# Patient Record
Sex: Male | Born: 1945 | Race: White | Hispanic: No | Marital: Married | Smoking: Never smoker
Health system: Southern US, Community
[De-identification: ages and names within clinical notes are randomized; demographics above are authoritative.]

## PROBLEM LIST (undated history)

## (undated) DIAGNOSIS — E785 Hyperlipidemia, unspecified: Secondary | ICD-10-CM

## (undated) HISTORY — DX: Hyperlipidemia, unspecified: E78.5

## (undated) HISTORY — PX: SHOULDER SURGERY: SHX246

## (undated) HISTORY — PX: HERNIA REPAIR: SHX51

---

## 2018-03-07 ENCOUNTER — Encounter: Payer: Self-pay | Admitting: *Deleted

## 2018-03-11 ENCOUNTER — Encounter: Payer: Self-pay | Admitting: *Deleted

## 2018-03-12 ENCOUNTER — Encounter

## 2018-03-12 ENCOUNTER — Telehealth: Payer: Self-pay | Admitting: Diagnostic Neuroimaging

## 2018-03-12 ENCOUNTER — Ambulatory Visit (INDEPENDENT_AMBULATORY_CARE_PROVIDER_SITE_OTHER): Payer: Federal, State, Local not specified - PPO | Admitting: Diagnostic Neuroimaging

## 2018-03-12 ENCOUNTER — Encounter: Payer: Self-pay | Admitting: Diagnostic Neuroimaging

## 2018-03-12 VITALS — BP 153/89 | HR 69 | Ht 70.0 in | Wt 200.8 lb

## 2018-03-12 DIAGNOSIS — R200 Anesthesia of skin: Secondary | ICD-10-CM

## 2018-03-12 DIAGNOSIS — R531 Weakness: Secondary | ICD-10-CM | POA: Diagnosis not present

## 2018-03-12 NOTE — Telephone Encounter (Signed)
Medicare/bcbs fed order sent to GI. No auth they will reach out to the pt to schedule.  °

## 2018-03-12 NOTE — Progress Notes (Signed)
[facility]  PATIENT: Brent Garcia DOB: Feb 22, 1946  REFERRING CLINICIAN: Dimas Chyle HISTORY FROM: patient  REASON FOR VISIT: new consult    HISTORICAL  CHIEF COMPLAINT:  Chief Complaint  Patient presents with  . New Patient (Initial Visit)    Rm 7, alone  . Numbness/ tingling of hands,  bil. feet    Referral Dr. Cyndia Bent.  Noted sx for 4 months. Notes when fatigued has more pain.  Takes aleve daily.  Hx of back pain 10 yrs ago and R leg pain one yr ago.     HISTORY OF PRESENT ILLNESS:   72 year old male here for evaluation of numbness and tingling.  4 months ago patient noticed onset of numbness and tingling in right greater than left hand, mainly in digits 4 and 5 bilaterally.  He also notes numbness in the balls of his feet.  Over time he noticed some pain and discomfort shooting in his right arm and right hand when he would lift it over his head and try to reach for an object.  Sometimes this generalized pain and weakness would radiate throughout his neck, arms and legs.  Patient has remote history of right low back pain radiating to the right leg with "tenderness" from a few years ago.  This has slightly improved but is persistent.  No other recent triggering factors.    REVIEW OF SYSTEMS: Full 14 system review of systems performed and negative with exception of: Numbness joint pain aching muscles.  ALLERGIES: No Known Allergies  HOME MEDICATIONS: Outpatient Medications Prior to Visit  Medication Sig Dispense Refill  . aspirin 81 MG chewable tablet Chew by mouth.    . cholecalciferol (VITAMIN D) 1000 units tablet Take 1,000 Units by mouth daily.    Marland Kitchen lovastatin (MEVACOR) 40 MG tablet TAKE 2 TABLETS BY MOUTH EVERY DAY    . naproxen sodium (ALEVE) 220 MG tablet Take by mouth.    . diclofenac (VOLTAREN) 75 MG EC tablet Take by mouth.    . sildenafil (VIAGRA) 100 MG tablet Take by mouth.     No facility-administered medications prior to visit.      PAST MEDICAL HISTORY: Past Medical History:  Diagnosis Date  . Hyperlipidemia     PAST SURGICAL HISTORY: Past Surgical History:  Procedure Laterality Date  . HERNIA REPAIR  1998?    FAMILY HISTORY: Family History  Problem Relation Age of Onset  . Heart attack Mother   . Non-Hodgkin's lymphoma Father   . Stroke Paternal Grandmother   . Heart Problems Paternal Grandfather     SOCIAL HISTORY: Social History   Socioeconomic History  . Marital status: Married    Spouse name: Not on file  . Number of children: Not on file  . Years of education: Not on file  . Highest education level: Not on file  Occupational History  . Not on file  Social Needs  . Financial resource strain: Not on file  . Food insecurity:    Worry: Not on file    Inability: Not on file  . Transportation needs:    Medical: Not on file    Non-medical: Not on file  Tobacco Use  . Smoking status: Never Smoker  . Smokeless tobacco: Never Used  Substance and Sexual Activity  . Alcohol use: Yes  . Drug use: Not on file  . Sexual activity: Not on file  Lifestyle  . Physical activity:    Days per week: Not on file    Minutes  per session: Not on file  . Stress: Not on file  Relationships  . Social connections:    Talks on phone: Not on file    Gets together: Not on file    Attends religious service: Not on file    Active member of club or organization: Not on file    Attends meetings of clubs or organizations: Not on file    Relationship status: Not on file  . Intimate partner violence:    Fear of current or ex partner: Not on file    Emotionally abused: Not on file    Physically abused: Not on file    Forced sexual activity: Not on file  Other Topics Concern  . Not on file  Social History Narrative   Lives with wife in home.  Education BA degree.  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  Children 2.       PHYSICAL EXAM  GENERAL EXAM/CONSTITUTIONAL: Vitals:  Vitals:   03/12/18 0809  BP: (!)  153/89  Pulse: 69  Weight: 200 lb 12.8 oz (91.1 kg)  Height: 5\' 10"  (1.778 m)     Body mass index is 28.81 kg/m. Wt Readings from Last 3 Encounters:  03/12/18 200 lb 12.8 oz (91.1 kg)     Patient is in no distress; well developed, nourished and groomed; neck is supple  CARDIOVASCULAR:  Examination of carotid arteries is normal; no carotid bruits  Regular rate and rhythm, no murmurs  Examination of peripheral vascular system by observation and palpation is normal  EYES:  Ophthalmoscopic exam of optic discs and posterior segments is normal; no papilledema or hemorrhages  Visual Acuity Screening   Right eye Left eye Both eyes  Without correction: 20/40 20/20   With correction:        MUSCULOSKELETAL:  Gait, strength, tone, movements noted in Neurologic exam below  NEUROLOGIC: MENTAL STATUS:  No flowsheet data found.  awake, alert, oriented to person, place and time  recent and remote memory intact  normal attention and concentration  language fluent, comprehension intact, naming intact  fund of knowledge appropriate  CRANIAL NERVE:   2nd - no papilledema on fundoscopic exam  2nd, 3rd, 4th, 6th - pupils equal and reactive to light, visual fields full to confrontation, extraocular muscles intact, no nystagmus  5th - facial sensation symmetric  7th - facial strength symmetric  8th - hearing intact  9th - palate elevates symmetrically, uvula midline  11th - shoulder shrug symmetric  12th - tongue protrusion midline  MOTOR:   FASCICULATIONS IN BILATERAL TRICEPS  normal bulk and tone, full strength in the BUE, BLE; EXCEPT BILATERAL TRICEPS 3  SENSORY:   normal and symmetric to light touch, pinprick, temperature, vibration; EXCEPT DECR TEMP AND VIB IN FEET; DECR PP IN FINGERTIPS (DIGIT 2 BILATERALLY)  COORDINATION:   finger-nose-finger, fine finger movements normal  REFLEXES:   deep tendon reflexes --> BUE ABSENT; BLE BRISK IN KNEES (L > R);  ANKLES 2; DOWN GOING TOES  GAIT/STATION:   narrow based gait; able to walk on toes, heels; romberg is negative     DIAGNOSTIC DATA (LABS, IMAGING, TESTING) - I reviewed patient records, labs, notes, testing and imaging myself where available.  No results found for: WBC, HGB, HCT, MCV, PLT No results found for: NA, K, CL, CO2, GLUCOSE, BUN, CREATININE, CALCIUM, PROT, ALBUMIN, AST, ALT, ALKPHOS, BILITOT, GFRNONAA, GFRAA No results found for: CHOL, HDL, LDLCALC, LDLDIRECT, TRIG, CHOLHDL No results found for: BOFB5Z No results found for: VITAMINB12 No  results found for: TSH   01/07/18 B12 - 664  01/07/18 TSH 3.070   ASSESSMENT AND PLAN  72 y.o. year old male here with upper and lower extremity numbness and tingling, weakness and fasciculations in bilateral triceps, slightly brisk reflexes in the knees.  Signs and symptoms are concerning for cervical radiculopathy and myelopathy.  We will proceed with further work-up.  Subacute onset of peripheral neuropathy also a possibility.   Ddx: cervical radiculopathies / myelopathy vs neuropathy (? CIDP)  1. Weakness   2. Numbness       PLAN:  - check MRI cervical (rule out myelopathy / radiculopathy) - then may consider EMG/NCS  Orders Placed This Encounter  Procedures  . MR CERVICAL SPINE WO CONTRAST   Return pending test results.    Suanne Marker, MD 03/12/2018, 8:42 AM Certified in Neurology, Neurophysiology and Neuroimaging  [facility] [address], [address] [place], [place] [address] [phone]

## 2018-03-12 NOTE — Patient Instructions (Signed)
-   check MRI cervical spine 

## 2018-03-18 ENCOUNTER — Ambulatory Visit
Admission: RE | Admit: 2018-03-18 | Discharge: 2018-03-18 | Disposition: A | Discharge status: Home / Self Care | Payer: Federal, State, Local not specified - PPO | Source: Ambulatory Visit | Attending: Diagnostic Neuroimaging | Admitting: Diagnostic Neuroimaging

## 2018-03-18 DIAGNOSIS — R200 Anesthesia of skin: Secondary | ICD-10-CM

## 2018-03-18 DIAGNOSIS — R531 Weakness: Secondary | ICD-10-CM

## 2018-03-19 ENCOUNTER — Telehealth: Payer: Self-pay | Admitting: Diagnostic Neuroimaging

## 2018-03-19 DIAGNOSIS — G9520 Unspecified cord compression: Secondary | ICD-10-CM

## 2018-03-19 NOTE — Telephone Encounter (Signed)
I called patient. MRI shows severe spinal stenosis in the cervical spine. I spoke to patient and wife. I recommend neurosurgery consult. Patient agrees.   Orders Placed This Encounter  Procedures  . Ambulatory referral to Neurosurgery   Suanne Marker, MD 03/19/2018, 6:24 PM Certified in Neurology, Neurophysiology and Neuroimaging  [facility] [address], [address] [place], [place] [address] [phone]

## 2018-03-20 ENCOUNTER — Other Ambulatory Visit: Payer: Self-pay | Admitting: Neurological Surgery

## 2018-03-31 NOTE — Pre-Procedure Instructions (Signed)
Brent Garcia  03/31/2018      [id number] - [place], [place] - [address] [place] [address] Phone: [phone] Fax: [phone]    Your procedure is scheduled on April 09, 2018.  Report to [facility] Admitting at 0800 AM.  Call this number if you have problems the morning of surgery:  [phone]   Remember:  Do not eat or drink after midnight.    Take these medicines the morning of surgery with A SIP OF WATER -none  Follow your surgeon's instructions on when to hold/resume aspirin.  If no instructions were given call the office to determine how they would like to you take aspirin  7 days prior to surgery STOP taking any Aleve, Naproxen, Ibuprofen, Motrin, Advil, Goody's, BC's, all herbal medications, fish oil, and all vitamins    Do not wear jewelry  Do not wear lotions, powders, or colognes, or deodorant.  Men may shave face and neck.  Do not bring valuables to the hospital.  [facility] is not responsible for any belongings or valuables.  Contacts, dentures or bridgework may not be worn into surgery.  Leave your suitcase in the car.  After surgery it may be brought to your room.  For patients admitted to the hospital, discharge time will be determined by your treatment team.  Patients discharged the day of surgery will not be allowed to drive home.    [facility]- Preparing For Surgery  Before surgery, you can play an important role. Because skin is not sterile, your skin needs to be as free of germs as possible. You can reduce the number of germs on your skin by washing with CHG (chlorahexidine gluconate) Soap before surgery.  CHG is an antiseptic cleaner which kills germs and bonds with the skin to continue killing germs even after washing.    Oral Hygiene is also important to reduce your risk of infection.  Remember - BRUSH YOUR TEETH THE MORNING OF SURGERY WITH YOUR REGULAR  TOOTHPASTE  Please do not use if you have an allergy to CHG or antibacterial soaps. If your skin becomes reddened/irritated stop using the CHG.  Do not shave (including legs and underarms) for at least 48 hours prior to first CHG shower. It is OK to shave your face.  Please follow these instructions carefully.   1. Shower the NIGHT BEFORE SURGERY and the MORNING OF SURGERY with CHG.   2. If you chose to wash your hair, wash your hair first as usual with your normal shampoo.  3. After you shampoo, rinse your hair and body thoroughly to remove the shampoo.  4. Use CHG as you would any other liquid soap. You can apply CHG directly to the skin and wash gently with a scrungie or a clean washcloth.   5. Apply the CHG Soap to your body ONLY FROM THE NECK DOWN.  Do not use on open wounds or open sores. Avoid contact with your eyes, ears, mouth and genitals (private parts). Wash Face and genitals (private parts)  with your normal soap.  6. Wash thoroughly, paying special attention to the area where your surgery will be performed.  7. Thoroughly rinse your body with warm water from the neck down.  8. DO NOT shower/wash with your normal soap after using and rinsing off the CHG Soap.  9. Pat yourself dry with a CLEAN TOWEL.  10. Wear CLEAN  PAJAMAS to bed the night before surgery, wear comfortable clothes the morning of surgery  11. Place CLEAN SHEETS on your bed the night of your first shower and DO NOT SLEEP WITH PETS.  Day of Surgery:  Do not apply any deodorants/lotions.  Please wear clean clothes to the hospital/surgery center.   Remember to brush your teeth WITH YOUR REGULAR TOOTHPASTE.   Please read over the following fact sheets that you were given. Pain Booklet, Coughing and Deep Breathing, MRSA Information and Surgical Site Infection Prevention

## 2018-03-31 NOTE — Progress Notes (Addendum)
PCP: [name], MD  Cardiologist: pt denies  EKG: pt denies past year, obtained today  Stress test: pt denies  ECHO: pt denies  Cardiac Cath: pt denies  Chest x-ray: pt denies past year, no recent respiratory infections/complications  Pt on aspirin 81 mg -started holding 04/01/18 per MD instructions

## 2018-04-01 ENCOUNTER — Encounter (HOSPITAL_COMMUNITY): Payer: Self-pay

## 2018-04-01 ENCOUNTER — Encounter (HOSPITAL_COMMUNITY)
Admission: RE | Admit: 2018-04-01 | Discharge: 2018-04-01 | Disposition: A | Discharge status: Home / Self Care | Payer: Medicare Other | Source: Ambulatory Visit | Attending: Neurological Surgery | Admitting: Neurological Surgery

## 2018-04-01 ENCOUNTER — Other Ambulatory Visit: Payer: Self-pay

## 2018-04-01 ENCOUNTER — Ambulatory Visit (HOSPITAL_COMMUNITY)
Admission: RE | Admit: 2018-04-01 | Discharge: 2018-04-01 | Disposition: A | Discharge status: Home / Self Care | Payer: Medicare Other | Source: Ambulatory Visit | Attending: Neurological Surgery | Admitting: Neurological Surgery

## 2018-04-01 DIAGNOSIS — R531 Weakness: Secondary | ICD-10-CM | POA: Diagnosis not present

## 2018-04-01 DIAGNOSIS — Z79899 Other long term (current) drug therapy: Secondary | ICD-10-CM | POA: Insufficient documentation

## 2018-04-01 DIAGNOSIS — E785 Hyperlipidemia, unspecified: Secondary | ICD-10-CM | POA: Diagnosis not present

## 2018-04-01 DIAGNOSIS — Z01818 Encounter for other preprocedural examination: Secondary | ICD-10-CM | POA: Insufficient documentation

## 2018-04-01 DIAGNOSIS — Z7982 Long term (current) use of aspirin: Secondary | ICD-10-CM | POA: Diagnosis not present

## 2018-04-01 DIAGNOSIS — R200 Anesthesia of skin: Secondary | ICD-10-CM | POA: Insufficient documentation

## 2018-04-01 DIAGNOSIS — M4802 Spinal stenosis, cervical region: Secondary | ICD-10-CM

## 2018-04-01 LAB — CBC WITH DIFFERENTIAL/PLATELET
ABS IMMATURE GRANULOCYTES: 0.1 10*3/uL (ref 0.0–0.1)
BASOS ABS: 0.1 10*3/uL (ref 0.0–0.1)
BASOS PCT: 1 %
Eosinophils Absolute: 0.3 10*3/uL (ref 0.0–0.7)
Eosinophils Relative: 4 %
HCT: 47.4 % (ref 39.0–52.0)
Hemoglobin: 15.2 g/dL (ref 13.0–17.0)
IMMATURE GRANULOCYTES: 1 %
Lymphocytes Relative: 22 %
Lymphs Abs: 1.5 10*3/uL (ref 0.7–4.0)
MCH: 31.7 pg (ref 26.0–34.0)
MCHC: 32.1 g/dL (ref 30.0–36.0)
MCV: 99 fL (ref 78.0–100.0)
Monocytes Absolute: 0.7 10*3/uL (ref 0.1–1.0)
Monocytes Relative: 10 %
NEUTROS ABS: 4.4 10*3/uL (ref 1.7–7.7)
NEUTROS PCT: 62 %
PLATELETS: 277 10*3/uL (ref 150–400)
RBC: 4.79 MIL/uL (ref 4.22–5.81)
RDW: 11.6 % (ref 11.5–15.5)
WBC: 7 10*3/uL (ref 4.0–10.5)

## 2018-04-01 LAB — BASIC METABOLIC PANEL
ANION GAP: 10 (ref 5–15)
BUN: 19 mg/dL (ref 8–23)
CALCIUM: 9.5 mg/dL (ref 8.9–10.3)
CO2: 25 mmol/L (ref 22–32)
Chloride: 104 mmol/L (ref 98–111)
Creatinine, Ser: 1.18 mg/dL (ref 0.61–1.24)
Glucose, Bld: 115 mg/dL — ABNORMAL HIGH (ref 70–99)
Potassium: 4.2 mmol/L (ref 3.5–5.1)
Sodium: 139 mmol/L (ref 135–145)

## 2018-04-01 LAB — SURGICAL PCR SCREEN
MRSA, PCR: NEGATIVE
Staphylococcus aureus: NEGATIVE

## 2018-04-01 LAB — ABO/RH: ABO/RH(D): O POS

## 2018-04-01 LAB — TYPE AND SCREEN
ABO/RH(D): O POS
ANTIBODY SCREEN: NEGATIVE

## 2018-04-01 LAB — PROTIME-INR
INR: 1.06
Prothrombin Time: 13.7 seconds (ref 11.4–15.2)

## 2018-04-08 NOTE — Anesthesia Preprocedure Evaluation (Addendum)
Anesthesia Evaluation  Patient identified by MRN, date of birth, ID band Patient awake    Reviewed: Allergy & Precautions, NPO status , Patient's Chart, lab work & pertinent test results  Airway Mallampati: II  TM Distance: >3 FB Neck ROM: Full    Dental  (+) Dental Advisory Given   Pulmonary neg pulmonary ROS,    breath sounds clear to auscultation       Cardiovascular negative cardio ROS   Rhythm:Regular Rate:Normal     Neuro/Psych negative neurological ROS     GI/Hepatic negative GI ROS, Neg liver ROS,   Endo/Other  negative endocrine ROS  Renal/GU negative Renal ROS     Musculoskeletal   Abdominal   Peds  Hematology negative hematology ROS (+)   Anesthesia Other Findings   Reproductive/Obstetrics                            Lab Results  Component Value Date   WBC 7.0 04/01/2018   HGB 15.2 04/01/2018   HCT 47.4 04/01/2018   MCV 99.0 04/01/2018   PLT 277 04/01/2018   Lab Results  Component Value Date   CREATININE 1.18 04/01/2018   BUN 19 04/01/2018   NA 139 04/01/2018   K 4.2 04/01/2018   CL 104 04/01/2018   CO2 25 04/01/2018    Anesthesia Physical Anesthesia Plan  ASA: II  Anesthesia Plan: General   Post-op Pain Management:    Induction: Intravenous  PONV Risk Score and Plan: 2 and Ondansetron, Dexamethasone and Treatment may vary due to age or medical condition  Airway Management Planned: Oral ETT and Video Laryngoscope Planned  Additional Equipment:   Intra-op Plan:   Post-operative Plan: Extubation in OR  Informed Consent: I have reviewed the patients History and Physical, chart, labs and discussed the procedure including the risks, benefits and alternatives for the proposed anesthesia with the patient or authorized representative who has indicated his/her understanding and acceptance.   Dental advisory given  Plan Discussed with: CRNA  Anesthesia Plan  Comments:        Anesthesia Quick Evaluation

## 2018-04-09 ENCOUNTER — Inpatient Hospital Stay (HOSPITAL_COMMUNITY): Payer: Medicare Other | Admitting: Anesthesiology

## 2018-04-09 ENCOUNTER — Inpatient Hospital Stay (HOSPITAL_COMMUNITY)
Admission: RE | Admit: 2018-04-09 | Discharge: 2018-04-10 | DRG: 473 | Disposition: A | Discharge status: Home / Self Care | Payer: Medicare Other | Source: Ambulatory Visit | Attending: Neurological Surgery | Admitting: Neurological Surgery

## 2018-04-09 ENCOUNTER — Encounter (HOSPITAL_COMMUNITY): Payer: Self-pay | Admitting: Urology

## 2018-04-09 ENCOUNTER — Encounter (HOSPITAL_COMMUNITY)
Admission: RE | Disposition: A | Discharge status: Home / Self Care | Payer: Self-pay | Source: Ambulatory Visit | Attending: Neurological Surgery

## 2018-04-09 ENCOUNTER — Inpatient Hospital Stay (HOSPITAL_COMMUNITY): Payer: Medicare Other

## 2018-04-09 DIAGNOSIS — Z419 Encounter for procedure for purposes other than remedying health state, unspecified: Secondary | ICD-10-CM

## 2018-04-09 DIAGNOSIS — Z7982 Long term (current) use of aspirin: Secondary | ICD-10-CM | POA: Diagnosis not present

## 2018-04-09 DIAGNOSIS — Z79899 Other long term (current) drug therapy: Secondary | ICD-10-CM | POA: Diagnosis not present

## 2018-04-09 DIAGNOSIS — E785 Hyperlipidemia, unspecified: Secondary | ICD-10-CM | POA: Diagnosis present

## 2018-04-09 DIAGNOSIS — M4712 Other spondylosis with myelopathy, cervical region: Principal | ICD-10-CM | POA: Diagnosis present

## 2018-04-09 DIAGNOSIS — Z791 Long term (current) use of non-steroidal anti-inflammatories (NSAID): Secondary | ICD-10-CM

## 2018-04-09 DIAGNOSIS — M50221 Other cervical disc displacement at C4-C5 level: Secondary | ICD-10-CM | POA: Diagnosis present

## 2018-04-09 DIAGNOSIS — M4322 Fusion of spine, cervical region: Secondary | ICD-10-CM | POA: Diagnosis present

## 2018-04-09 HISTORY — PX: ANTERIOR CERVICAL DECOMPRESSION/DISCECTOMY FUSION 4 LEVELS: SHX5556

## 2018-04-09 SURGERY — ANTERIOR CERVICAL DECOMPRESSION/DISCECTOMY FUSION 4 LEVELS
Anesthesia: General | Site: Spine Cervical

## 2018-04-09 MED ORDER — HEMOSTATIC AGENTS (NO CHARGE) OPTIME
TOPICAL | Status: DC | PRN
  Administered 2018-04-09: 1 via TOPICAL

## 2018-04-09 MED ORDER — OXYCODONE HCL 5 MG PO TABS: 5.0000 mg | ORAL_TABLET | ORAL | Status: DC | PRN

## 2018-04-09 MED ORDER — ONDANSETRON HCL 4 MG/2ML IJ SOLN
INTRAMUSCULAR | Status: AC
  Filled 2018-04-09: qty 2

## 2018-04-09 MED ORDER — METHOCARBAMOL 500 MG PO TABS
500.0000 mg | ORAL_TABLET | Freq: Four times a day (QID) | ORAL | Status: DC | PRN
  Administered 2018-04-09 – 2018-04-10 (×3): 500 mg via ORAL
  Filled 2018-04-09 (×3): qty 1

## 2018-04-09 MED ORDER — ROCURONIUM BROMIDE 50 MG/5ML IV SOSY
PREFILLED_SYRINGE | INTRAVENOUS | Status: DC | PRN
  Administered 2018-04-09: 10 mg via INTRAVENOUS
  Administered 2018-04-09: 20 mg via INTRAVENOUS
  Administered 2018-04-09 (×2): 10 mg via INTRAVENOUS
  Administered 2018-04-09: 20 mg via INTRAVENOUS
  Administered 2018-04-09: 50 mg via INTRAVENOUS
  Administered 2018-04-09 (×3): 20 mg via INTRAVENOUS

## 2018-04-09 MED ORDER — THROMBIN 5000 UNITS EX SOLR
OROMUCOSAL | Status: DC | PRN
  Administered 2018-04-09: 5 mL via TOPICAL

## 2018-04-09 MED ORDER — ROCURONIUM BROMIDE 50 MG/5ML IV SOSY
PREFILLED_SYRINGE | INTRAVENOUS | Status: AC
  Filled 2018-04-09: qty 5

## 2018-04-09 MED ORDER — PHENOL 1.4 % MT LIQD: 1.0000 | OROMUCOSAL | Status: DC | PRN

## 2018-04-09 MED ORDER — CHLORHEXIDINE GLUCONATE CLOTH 2 % EX PADS: 6.0000 | MEDICATED_PAD | Freq: Once | CUTANEOUS | Status: DC

## 2018-04-09 MED ORDER — ONDANSETRON HCL 4 MG/2ML IJ SOLN
INTRAMUSCULAR | Status: DC | PRN
  Administered 2018-04-09: 4 mg via INTRAVENOUS

## 2018-04-09 MED ORDER — PROPOFOL 10 MG/ML IV BOLUS
INTRAVENOUS | Status: AC
  Filled 2018-04-09: qty 20

## 2018-04-09 MED ORDER — FENTANYL CITRATE (PF) 250 MCG/5ML IJ SOLN
INTRAMUSCULAR | Status: AC
  Filled 2018-04-09: qty 5

## 2018-04-09 MED ORDER — PROMETHAZINE HCL 25 MG/ML IJ SOLN: 6.2500 mg | INTRAMUSCULAR | Status: DC | PRN

## 2018-04-09 MED ORDER — SODIUM CHLORIDE 0.9% FLUSH: 3.0000 mL | Freq: Two times a day (BID) | INTRAVENOUS | Status: DC

## 2018-04-09 MED ORDER — SUGAMMADEX SODIUM 200 MG/2ML IV SOLN
INTRAVENOUS | Status: DC | PRN
  Administered 2018-04-09: 200 mg via INTRAVENOUS

## 2018-04-09 MED ORDER — 0.9 % SODIUM CHLORIDE (POUR BTL) OPTIME
TOPICAL | Status: DC | PRN
  Administered 2018-04-09: 1000 mL

## 2018-04-09 MED ORDER — DEXAMETHASONE SODIUM PHOSPHATE 10 MG/ML IJ SOLN
INTRAMUSCULAR | Status: AC
  Filled 2018-04-09: qty 1

## 2018-04-09 MED ORDER — PHENYLEPHRINE 40 MCG/ML (10ML) SYRINGE FOR IV PUSH (FOR BLOOD PRESSURE SUPPORT)
PREFILLED_SYRINGE | INTRAVENOUS | Status: DC | PRN
  Administered 2018-04-09 (×5): 80 ug via INTRAVENOUS

## 2018-04-09 MED ORDER — LIDOCAINE 2% (20 MG/ML) 5 ML SYRINGE
INTRAMUSCULAR | Status: AC
  Filled 2018-04-09: qty 5

## 2018-04-09 MED ORDER — SENNA 8.6 MG PO TABS
1.0000 | ORAL_TABLET | Freq: Two times a day (BID) | ORAL | Status: DC
  Administered 2018-04-09: 8.6 mg via ORAL
  Filled 2018-04-09: qty 1

## 2018-04-09 MED ORDER — CEFAZOLIN SODIUM-DEXTROSE 2-4 GM/100ML-% IV SOLN
2.0000 g | INTRAVENOUS | Status: AC
  Administered 2018-04-09: 2 g via INTRAVENOUS

## 2018-04-09 MED ORDER — SODIUM CHLORIDE 0.9 % IV SOLN
250.0000 mL | INTRAVENOUS | Status: DC
  Administered 2018-04-09: 250 mL via INTRAVENOUS

## 2018-04-09 MED ORDER — ONDANSETRON HCL 4 MG PO TABS: 4.0000 mg | ORAL_TABLET | Freq: Four times a day (QID) | ORAL | Status: DC | PRN

## 2018-04-09 MED ORDER — DEXAMETHASONE SODIUM PHOSPHATE 4 MG/ML IJ SOLN
INTRAMUSCULAR | Status: DC | PRN
  Administered 2018-04-09: 10 mg via INTRAVENOUS

## 2018-04-09 MED ORDER — ALBUMIN HUMAN 5 % IV SOLN
INTRAVENOUS | Status: DC | PRN
  Administered 2018-04-09: 12:00:00 via INTRAVENOUS

## 2018-04-09 MED ORDER — MIDAZOLAM HCL 2 MG/2ML IJ SOLN
INTRAMUSCULAR | Status: AC
  Filled 2018-04-09: qty 2

## 2018-04-09 MED ORDER — ROCURONIUM BROMIDE 100 MG/10ML IV SOLN: INTRAVENOUS | Status: DC | PRN

## 2018-04-09 MED ORDER — HYDROMORPHONE HCL 1 MG/ML IJ SOLN: 0.5000 mg | INTRAMUSCULAR | Status: DC | PRN

## 2018-04-09 MED ORDER — CEFAZOLIN SODIUM-DEXTROSE 2-4 GM/100ML-% IV SOLN
INTRAVENOUS | Status: AC
  Filled 2018-04-09: qty 100

## 2018-04-09 MED ORDER — THROMBIN (RECOMBINANT) 20000 UNITS EX SOLR
CUTANEOUS | Status: AC
  Filled 2018-04-09: qty 20000

## 2018-04-09 MED ORDER — POTASSIUM CHLORIDE IN NACL 20-0.9 MEQ/L-% IV SOLN: INTRAVENOUS | Status: DC

## 2018-04-09 MED ORDER — ACETAMINOPHEN 650 MG RE SUPP: 650.0000 mg | RECTAL | Status: DC | PRN

## 2018-04-09 MED ORDER — LACTATED RINGERS IV SOLN
INTRAVENOUS | Status: DC
  Administered 2018-04-09: 08:00:00 via INTRAVENOUS

## 2018-04-09 MED ORDER — ACETAMINOPHEN 500 MG PO TABS
500.0000 mg | ORAL_TABLET | Freq: Four times a day (QID) | ORAL | Status: DC | PRN
  Administered 2018-04-09 (×2): 1000 mg via ORAL
  Filled 2018-04-09 (×2): qty 2

## 2018-04-09 MED ORDER — ONDANSETRON HCL 4 MG/2ML IJ SOLN: 4.0000 mg | Freq: Four times a day (QID) | INTRAMUSCULAR | Status: DC | PRN

## 2018-04-09 MED ORDER — DEXAMETHASONE SODIUM PHOSPHATE 4 MG/ML IJ SOLN
4.0000 mg | Freq: Four times a day (QID) | INTRAMUSCULAR | Status: DC
  Administered 2018-04-09 (×2): 4 mg via INTRAVENOUS
  Filled 2018-04-09 (×2): qty 1

## 2018-04-09 MED ORDER — MENTHOL 3 MG MT LOZG: 1.0000 | LOZENGE | OROMUCOSAL | Status: DC | PRN

## 2018-04-09 MED ORDER — LACTATED RINGERS IV SOLN
INTRAVENOUS | Status: DC | PRN
  Administered 2018-04-09 (×3): via INTRAVENOUS

## 2018-04-09 MED ORDER — BUPIVACAINE HCL (PF) 0.25 % IJ SOLN
INTRAMUSCULAR | Status: DC | PRN
  Administered 2018-04-09: 7 mL

## 2018-04-09 MED ORDER — MIDAZOLAM HCL 5 MG/5ML IJ SOLN
INTRAMUSCULAR | Status: DC | PRN
  Administered 2018-04-09: 2 mg via INTRAVENOUS
  Administered 2018-04-09: 1 mg via INTRAVENOUS

## 2018-04-09 MED ORDER — LIDOCAINE 2% (20 MG/ML) 5 ML SYRINGE
INTRAMUSCULAR | Status: DC | PRN
  Administered 2018-04-09: 80 mg via INTRAVENOUS

## 2018-04-09 MED ORDER — ACETAMINOPHEN 325 MG PO TABS: 650.0000 mg | ORAL_TABLET | ORAL | Status: DC | PRN

## 2018-04-09 MED ORDER — DEXAMETHASONE SODIUM PHOSPHATE 10 MG/ML IJ SOLN: 10.0000 mg | INTRAMUSCULAR | Status: DC

## 2018-04-09 MED ORDER — FENTANYL CITRATE (PF) 100 MCG/2ML IJ SOLN
INTRAMUSCULAR | Status: DC | PRN
  Administered 2018-04-09 (×3): 50 ug via INTRAVENOUS
  Administered 2018-04-09: 100 ug via INTRAVENOUS
  Administered 2018-04-09 (×4): 50 ug via INTRAVENOUS

## 2018-04-09 MED ORDER — SODIUM CHLORIDE 0.9 % IV SOLN: 250.0000 mL | INTRAVENOUS | Status: DC

## 2018-04-09 MED ORDER — PROPOFOL 10 MG/ML IV BOLUS
INTRAVENOUS | Status: DC | PRN
  Administered 2018-04-09: 20 mg via INTRAVENOUS
  Administered 2018-04-09: 150 mg via INTRAVENOUS
  Administered 2018-04-09: 50 mg via INTRAVENOUS

## 2018-04-09 MED ORDER — FENTANYL CITRATE (PF) 100 MCG/2ML IJ SOLN: 25.0000 ug | INTRAMUSCULAR | Status: DC | PRN

## 2018-04-09 MED ORDER — PHENYLEPHRINE 40 MCG/ML (10ML) SYRINGE FOR IV PUSH (FOR BLOOD PRESSURE SUPPORT)
PREFILLED_SYRINGE | INTRAVENOUS | Status: AC
  Filled 2018-04-09: qty 10

## 2018-04-09 MED ORDER — CEFAZOLIN SODIUM-DEXTROSE 2-4 GM/100ML-% IV SOLN
2.0000 g | Freq: Three times a day (TID) | INTRAVENOUS | Status: AC
  Administered 2018-04-09 – 2018-04-10 (×2): 2 g via INTRAVENOUS
  Filled 2018-04-09 (×2): qty 100

## 2018-04-09 MED ORDER — DEXAMETHASONE 4 MG PO TABS
4.0000 mg | ORAL_TABLET | Freq: Four times a day (QID) | ORAL | Status: DC
  Administered 2018-04-10: 4 mg via ORAL
  Filled 2018-04-09: qty 1

## 2018-04-09 MED ORDER — BUPIVACAINE HCL (PF) 0.25 % IJ SOLN
INTRAMUSCULAR | Status: AC
  Filled 2018-04-09: qty 30

## 2018-04-09 MED ORDER — SODIUM CHLORIDE 0.9 % IV SOLN
INTRAVENOUS | Status: DC | PRN
  Administered 2018-04-09: 60 ug/min via INTRAVENOUS
  Administered 2018-04-09: 13:00:00 via INTRAVENOUS

## 2018-04-09 MED ORDER — SODIUM CHLORIDE 0.9% FLUSH: 3.0000 mL | INTRAVENOUS | Status: DC | PRN

## 2018-04-09 MED ORDER — METHOCARBAMOL 1000 MG/10ML IJ SOLN
500.0000 mg | Freq: Four times a day (QID) | INTRAVENOUS | Status: DC | PRN
  Filled 2018-04-09: qty 5

## 2018-04-09 MED ORDER — THROMBIN 5000 UNITS EX SOLR
CUTANEOUS | Status: AC
  Filled 2018-04-09: qty 5000

## 2018-04-09 MED ORDER — SODIUM CHLORIDE 0.9 % IV SOLN
INTRAVENOUS | Status: DC | PRN
  Administered 2018-04-09: 500 mL

## 2018-04-09 SURGICAL SUPPLY — 55 items
BAG DECANTER FOR FLEXI CONT (MISCELLANEOUS) ×3 IMPLANT
BASKET BONE COLLECTION (BASKET) ×3 IMPLANT
BENZOIN TINCTURE PRP APPL 2/3 (GAUZE/BANDAGES/DRESSINGS) ×3 IMPLANT
BIT DRILL 14MM (INSTRUMENTS) ×1 IMPLANT
BUR MATCHSTICK NEURO 3.0 LAGG (BURR) ×3 IMPLANT
CANISTER SUCT 3000ML PPV (MISCELLANEOUS) ×3 IMPLANT
CARTRIDGE OIL MAESTRO DRILL (MISCELLANEOUS) ×1 IMPLANT
CLOSURE WOUND 1/2 X4 (GAUZE/BANDAGES/DRESSINGS) ×1
DIFFUSER DRILL AIR PNEUMATIC (MISCELLANEOUS) ×3 IMPLANT
DRAIN CHANNEL 7F 3/4 FLAT (WOUND CARE) ×3 IMPLANT
DRAPE C-ARM 42X72 X-RAY (DRAPES) ×6 IMPLANT
DRAPE LAPAROTOMY 100X72 PEDS (DRAPES) ×3 IMPLANT
DRAPE MICROSCOPE LEICA (MISCELLANEOUS) ×3 IMPLANT
DRILL 14MM (INSTRUMENTS) ×3
DRSG OPSITE POSTOP 4X6 (GAUZE/BANDAGES/DRESSINGS) ×3 IMPLANT
DURAPREP 6ML APPLICATOR 50/CS (WOUND CARE) ×3 IMPLANT
ELECT COATED BLADE 2.86 ST (ELECTRODE) ×3 IMPLANT
ELECT REM PT RETURN 9FT ADLT (ELECTROSURGICAL) ×3
ELECTRODE REM PT RTRN 9FT ADLT (ELECTROSURGICAL) ×1 IMPLANT
EVACUATOR SILICONE 100CC (DRAIN) ×3 IMPLANT
GAUZE 4X4 16PLY RFD (DISPOSABLE) IMPLANT
GLOVE BIO SURGEON STRL SZ7 (GLOVE) ×3 IMPLANT
GLOVE BIO SURGEON STRL SZ8 (GLOVE) ×3 IMPLANT
GLOVE BIOGEL PI IND STRL 7.0 (GLOVE) ×1 IMPLANT
GLOVE BIOGEL PI INDICATOR 7.0 (GLOVE) ×2
GLOVE SURG SS PI 7.5 STRL IVOR (GLOVE) ×12 IMPLANT
GOWN STRL REUS W/ TWL LRG LVL3 (GOWN DISPOSABLE) ×3 IMPLANT
GOWN STRL REUS W/ TWL XL LVL3 (GOWN DISPOSABLE) ×2 IMPLANT
GOWN STRL REUS W/TWL 2XL LVL3 (GOWN DISPOSABLE) IMPLANT
GOWN STRL REUS W/TWL LRG LVL3 (GOWN DISPOSABLE) ×6
GOWN STRL REUS W/TWL XL LVL3 (GOWN DISPOSABLE) ×4
HEMOSTAT POWDER KIT SURGIFOAM (HEMOSTASIS) ×3 IMPLANT
KIT BASIN OR (CUSTOM PROCEDURE TRAY) ×3 IMPLANT
KIT TURNOVER KIT B (KITS) ×3 IMPLANT
MATRIX HEMOSTAT SURGIFLO (HEMOSTASIS) ×3 IMPLANT
NEEDLE HYPO 25X1 1.5 SAFETY (NEEDLE) ×3 IMPLANT
NEEDLE SPNL 20GX3.5 QUINCKE YW (NEEDLE) ×3 IMPLANT
NS IRRIG 1000ML POUR BTL (IV SOLUTION) ×3 IMPLANT
OIL CARTRIDGE MAESTRO DRILL (MISCELLANEOUS) ×3
PACK LAMINECTOMY NEURO (CUSTOM PROCEDURE TRAY) ×3 IMPLANT
PAD ARMBOARD 7.5X6 YLW CONV (MISCELLANEOUS) ×9 IMPLANT
PIN DISTRACTION 14MM (PIN) ×6 IMPLANT
PLATE 64MM (Plate) ×3 IMPLANT
RUBBERBAND STERILE (MISCELLANEOUS) ×6 IMPLANT
SCREW 16MM (Screw) ×30 IMPLANT
SPACER IDENTITI 7X18X16 7D (Spacer) ×3 IMPLANT
SPACER IDENTITI PS 6X18X16 7D (Spacer) ×9 IMPLANT
SPONGE INTESTINAL PEANUT (DISPOSABLE) ×3 IMPLANT
SPONGE SURGIFOAM ABS GEL 100 (HEMOSTASIS) ×3 IMPLANT
STRIP CLOSURE SKIN 1/2X4 (GAUZE/BANDAGES/DRESSINGS) ×2 IMPLANT
SUT VIC AB 3-0 SH 8-18 (SUTURE) ×6 IMPLANT
SUT VICRYL 4-0 PS2 18IN ABS (SUTURE) ×3 IMPLANT
TOWEL GREEN STERILE (TOWEL DISPOSABLE) ×3 IMPLANT
TOWEL GREEN STERILE FF (TOWEL DISPOSABLE) ×3 IMPLANT
WATER STERILE IRR 1000ML POUR (IV SOLUTION) ×3 IMPLANT

## 2018-04-09 NOTE — H&P (Signed)
Subjective:   Patient is a 72 y.o. male admitted for myelopathy. The patient first presented to me with complaints of numbness of the arm(s). Onset of symptoms was several months ago. The pain is described as aching and occurs intermittently. The pain is rated mild, and is located in the neck and radiates to the arms. The symptoms have been progressive. Symptoms are exacerbated by extending head backwards, and are relieved by none.  Previous work up includes MRI of cervical spine, results: spinal stenosis.  Past Medical History:  Diagnosis Date  . Hyperlipidemia     Past Surgical History:  Procedure Laterality Date  . HERNIA REPAIR  1998?  Marland Kitchen SHOULDER SURGERY      No Known Allergies  Social History   Tobacco Use  . Smoking status: Never Smoker  . Smokeless tobacco: Never Used  Substance Use Topics  . Alcohol use: Yes    Comment: socially    Family History  Problem Relation Age of Onset  . Heart attack Mother   . Non-Hodgkin's lymphoma Father   . Stroke Paternal Grandmother   . Heart Problems Paternal Grandfather    Prior to Admission medications   Medication Sig Start Date End Date Taking? Authorizing Provider  cholecalciferol (VITAMIN D) 1000 units tablet Take 1,000 Units by mouth daily.   Yes [provider]  lovastatin (MEVACOR) 40 MG tablet Take 80 mg by mouth daily.  12/30/17  Yes [provider]  Multiple Vitamin (MULTIVITAMIN WITH MINERALS) TABS tablet Take 1 tablet by mouth daily. Men 50+   Yes [provider]  naproxen sodium (ALEVE) 220 MG tablet Take 440 mg by mouth daily.    Yes [provider]  aspirin 81 MG chewable tablet Chew 81 mg by mouth daily.     [provider]     Review of Systems  Positive ROS: neg  All other systems have been reviewed and were otherwise negative with the exception of those mentioned in the HPI and as above.  Objective: Vital signs in last 24 hours: Temp:  [97.7 F (36.5 C)] 97.7 F  (36.5 C) (10/02 0757) Pulse Rate:  [68] 68 (10/02 0757) Resp:  [20] 20 (10/02 0757) BP: (142)/(89) 142/89 (10/02 0757) SpO2:  [95 %] 95 % (10/02 0757)  General Appearance: Alert, cooperative, no distress, appears stated age Head: Normocephalic, without obvious abnormality, atraumatic Eyes: PERRL, conjunctiva/corneas clear, EOM's intact      Neck: Supple, symmetrical, trachea midline, Back: Symmetric, no curvature, ROM normal, no CVA tenderness Lungs:  respirations unlabored Heart: Regular rate and rhythm Abdomen: Soft, non-tender Extremities: Extremities normal, atraumatic, no cyanosis or edema Pulses: 2+ and symmetric all extremities Skin: Skin color, texture, turgor normal, no rashes or lesions  NEUROLOGIC:  Mental status: Alert and oriented x4, no aphasia, good attention span, fund of knowledge and memory  Motor Exam - grossly normal Sensory Exam - grossly normal Reflexes: 3+ Coordination - grossly normal Gait - not tested Balance - grossly normal Cranial Nerves: I: smell Not tested  II: visual acuity  OS: nl    OD: nl  II: visual fields Full to confrontation  II: pupils Equal, round, reactive to light  III,VII: ptosis None  III,IV,VI: extraocular muscles  Full ROM  V: mastication Normal  V: facial light touch sensation  Normal  V,VII: corneal reflex  Present  VII: facial muscle function - upper  Normal  VII: facial muscle function - lower Normal  VIII: hearing Not tested  IX: soft palate  elevation  Normal  IX,X: gag reflex Present  XI: trapezius strength  5/5  XI: sternocleidomastoid strength 5/5  XI: neck flexion strength  5/5  XII: tongue strength  Normal    Data Review Lab Results  Component Value Date   WBC 7.0 04/01/2018   HGB 15.2 04/01/2018   HCT 47.4 04/01/2018   MCV 99.0 04/01/2018   PLT 277 04/01/2018   Lab Results  Component Value Date   NA 139 04/01/2018   K 4.2 04/01/2018   CL 104 04/01/2018   CO2 25 04/01/2018   BUN 19 04/01/2018    CREATININE 1.18 04/01/2018   GLUCOSE 115 (H) 04/01/2018   Lab Results  Component Value Date   INR 1.06 04/01/2018    Assessment:   Cervical neck pain with herniated nucleus pulposus/ spondylosis/ stenosis at C3-7. Estimated body mass index is 28.78 kg/m as calculated from the following:   Height as of 04/01/18: 5\' 10"  (1.778 m).   Weight as of 04/01/18: 91 kg.  Patient has failed conservative therapy. Planned surgery : ACDF C3-4 C4-5 C5-6 C6-7  Plan:   I explained the condition and procedure to the patient and answered any questions.  Patient wishes to proceed with procedure as planned. Understands risks/ benefits/ and expected or typical outcomes.  Hassani Sliney S 04/09/2018 9:50 AM

## 2018-04-09 NOTE — Op Note (Signed)
04/09/2018  2:59 PM  PATIENT:  Brent Garcia  72 y.o. 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 Decompressive anterior cervical discectomy C3-4 C4-5 C5-6 C6-7, 2. Anterior cervical arthrodesis C3-4 C4-5 C5-6 C6-7 utilizing a pti interbody cage packed with locally harvested morcellized autologous bone graft, 3. Anterior cervical plating C3-C7 utilizing a ATEC plate  SURGEON:  Marikay Alar, MD  ASSISTANTS: Meyran FNP  ANESTHESIA:   General  EBL: 300 ml  Total I/O In: 2250 [I.V.:2000; IV Piggyback:250] Out: 640 [Urine:340; Blood:300]  BLOOD ADMINISTERED: none  DRAINS: none  SPECIMEN:  none  INDICATION FOR PROCEDURE: This patient presented with numbness in his hands, change in his gait, and numbness in his arms. Imaging showed very spinal stenosis in the cervical spine with signal change in the spinal cord. The patient tried conservative measures without relief.  Recommended ACDF with plating. Patient understood the risks, benefits, and alternatives and potential outcomes and wished to proceed.  PROCEDURE DETAILS: Patient was brought to the operating room placed under general endotracheal anesthesia. Patient was placed in the supine position on the operating room table. The neck was prepped with Duraprep and draped in a sterile fashion.   Three cc of local anesthesia was injected and a transverse incision was made on the right side of the neck.  Dissection was carried down thru the subcutaneous tissue and the platysma was  elevated, opened, and undermined with Metzenbaum scissors.  Dissection was then carried out thru an avascular plane leaving the sternocleidomastoid carotid artery and jugular vein laterally and the trachea and esophagus medially. The ventral aspect of the vertebral column was identified and a localizing  x-ray was taken. The C3-4 level was identified. The longus colli muscles were then elevated and the retractor was placed to expose C3-4 C4-5 C5-6 and C6-7.  He had severe degenerative disc disease with loss of disc space height at each level.  Distraction pins were used to distract the disc space and then the disc spaces were widened utilizing the high-speed drill.  The exact same decompression was performed at all 4 levels.  The annulus was incised and the disc space entered. Discectomy was performed with micro-curettes and pituitary rongeurs. I then used the high-speed drill to drill the endplates down to the level of the posterior longitudinal ligament. The drill shavings were saved in a mucous trap for later arthrodesis. The operating microscope was draped and brought into the field provided additional magnification, illumination and visualization. Discectomy was continued posteriorly thru the disc space. Posterior longitudinal ligament was opened with a nerve hook, and then removed along with disc herniation and osteophytes, decompressing the spinal canal and thecal sac. We then continued to remove osteophytic overgrowth and disc material decompressing the neural foramina and exiting nerve roots bilaterally. The scope was angled up and down to help decompress and undercut the vertebral bodies.  Decompressing C5-6 on the right I had an unintended durotomy over the right C6 nerve root.  This was covered with Gelfoam.  Once the decompression was completed we could pass a nerve hook circumferentially to assure adequate decompression in the midline and in the neural foramina. So by both visualization and palpation we felt we had an adequate decompression of the neural elements. We then measured the height of the intravertebral disc space and selected a 6 millimeter PTI interbody cage packed with autograft for C3-4  C4-5 and C6-7, and a 7 mm graft for C5-6. It was then gently positioned in the intravertebral disc  space(s) and countersunk. I then used a 64 mm Alphatec plate and placed variable angle screws into the vertebral bodies of each level of C3-C7 and locked them into position. The wound was irrigated with bacitracin solution, checked for hemostasis which was established and confirmed. Once meticulous hemostasis was achieved, placed a 7 flat JP drain and we then proceeded with closure. The platysma was closed with interrupted 3-0 undyed Vicryl suture, the subcuticular layer was closed with interrupted 3-0 undyed Vicryl suture. The skin edges were approximated with steristrips. The drapes were removed. A sterile dressing was applied. The patient was then awakened from general anesthesia and transferred to the recovery room in stable condition. At the end of the procedure all sponge, needle and instrument counts were correct.   PLAN OF CARE: Admit to inpatient   PATIENT DISPOSITION:  PACU - hemodynamically stable.   Delay start of Pharmacological VTE agent (>24hrs) due to surgical blood loss or risk of bleeding:  yes

## 2018-04-09 NOTE — Anesthesia Procedure Notes (Signed)
Procedure Name: Intubation Date/Time: 04/09/2018 10:06 AM Performed by: Julian Reil, CRNA Pre-anesthesia Checklist: Patient identified, Emergency Drugs available, Suction available and Patient being monitored Patient Re-evaluated:Patient Re-evaluated prior to induction Oxygen Delivery Method: Circle system utilized Preoxygenation: Pre-oxygenation with 100% oxygen Induction Type: IV induction Ventilation: Mask ventilation without difficulty and Oral airway inserted - appropriate to patient size Laryngoscope Size: Glidescope and 4 Grade View: Grade I Tube type: Oral Tube size: 7.5 mm Number of attempts: 1 Airway Equipment and Method: Stylet and Video-laryngoscopy Placement Confirmation: ETT inserted through vocal cords under direct vision,  positive ETCO2 and breath sounds checked- equal and bilateral Secured at: 24 cm Tube secured with: Tape Dental Injury: Teeth and Oropharynx as per pre-operative assessment  Comments: Glidescope used due to limited ROM of neck and H/O cervical spine stenosis.  4x4s bite block used.

## 2018-04-09 NOTE — Transfer of Care (Signed)
Immediate Anesthesia Transfer of Care Note  Patient: YORDY MATTON  Procedure(s) Performed: ANTERIOR CERVICAL DECOMPRESSON AND FUSION CERVICAL THREE-FOUR,CERVICAL FOUR-FIVE,CERVICAL FIVE-SIX,CERVICAL SIX-SEVEN (N/A Spine Cervical)  Patient Location: PACU  Anesthesia Type:General  Level of Consciousness: awake, alert  and oriented  Airway & Oxygen Therapy: Patient Spontanous Breathing and Patient connected to face mask oxygen  Post-op Assessment: Report given to RN and Post -op Vital signs reviewed and stable  Post vital signs: Reviewed and stable  Last Vitals:  Vitals Value Taken Time  BP 111/68 04/09/2018  2:59 PM  Temp    Pulse 92 04/09/2018  3:03 PM  Resp 18 04/09/2018  3:03 PM  SpO2 95 % 04/09/2018  3:03 PM  Vitals shown include unvalidated device data.  Last Pain:  Vitals:   04/09/18 0804  TempSrc:   PainSc: 0-No pain         Complications: No apparent anesthesia complications

## 2018-04-10 ENCOUNTER — Encounter (HOSPITAL_COMMUNITY): Payer: Self-pay | Admitting: Neurological Surgery

## 2018-04-10 MED ORDER — HYDROCODONE-ACETAMINOPHEN 5-325 MG PO TABS
1.0000 | ORAL_TABLET | ORAL | Status: DC | PRN
  Administered 2018-04-10: 1 via ORAL
  Filled 2018-04-10: qty 1

## 2018-04-10 MED ORDER — METHOCARBAMOL 500 MG PO TABS: 500.0000 mg | ORAL_TABLET | Freq: Four times a day (QID) | ORAL | 0 refills | Status: AC | PRN

## 2018-04-10 MED ORDER — HYDROCODONE-ACETAMINOPHEN 5-325 MG PO TABS: 1.0000 | ORAL_TABLET | ORAL | 0 refills | Status: AC | PRN

## 2018-04-10 NOTE — Progress Notes (Signed)
Patient alert and oriented, mae's well, voiding adequate amount of urine, swallowing without difficulty, no c/o pain at time of discharge. Patient discharged home with family. Script and discharged instructions given to patient. Patient and family stated understanding of instructions given. Patient has an appointment with Dr. Jones °

## 2018-04-10 NOTE — Evaluation (Signed)
Occupational Therapy Evaluation and Discharge Patient Details Name: Brent Garcia MRN: [id number] DOB: 03/07/1946 Today's Date: 04/10/2018    History of Present Illness Patient is a 72 y.o. male admitted for myelopathy. MRI revealed spinal stenosis. Pt nw s/p Decompressive anterior cervical discectomy C3-4 C4-5 C5-6 C6-7. Anterior cervical arthrodesis C3-4 C4-5 C5-6 C6-7. Anterior cervical plating C3-C7.   Clinical Impression   PTA Pt independent in ADL and mobility. Pt is currently mod I for ADL and mobility. Pt provided cervical biomechanics handout and educated on special application to ADL. Pt educated on: set an alarm at night for medication, avoid sitting for long periods of time, correct bed positioning for sleeping, correct sequence for bed mobility. All education is complete and patient indicates understanding.  Pt does still have some lingering decreased sensation in bilateral hands as well as deficits in RUE ROM. Educated Pt to keep using functionally and at follow up appointment with MD potentially seek further therapy to see if symptoms resolve and ROM returns.      Follow Up Recommendations  Follow surgeon's recommendation for DC plan and follow-up therapies(check at 2 week follow up with MD about RUE)    Equipment Recommendations  None recommended by OT    Recommendations for Other Services       Precautions / Restrictions Precautions Precautions: Cervical Precaution Booklet Issued: Yes (comment) Precaution Comments: reviewed cervical biomechanic handout in full Required Braces or Orthoses: Cervical Brace(no brace per MD order) Cervical Brace: Soft collar;For comfort Restrictions Weight Bearing Restrictions: No      Mobility Bed Mobility Overal bed mobility: Modified Independent             General bed mobility comments: educated in log roll technique  Transfers Overall transfer level: Independent                    Balance Overall balance  assessment: Independent                                         ADL either performed or assessed with clinical judgement   ADL Overall ADL's : Modified independent                                       General ADL Comments: Pt able to dress LB, perform toilet transfer and sink level grooming implementing compensatory strategies for ADL and maintaining good cervical biomechanics     Vision Patient Visual Report: No change from baseline Vision Assessment?: No apparent visual deficits     Perception     Praxis      Pertinent Vitals/Pain Pain Assessment: Faces Faces Pain Scale: Hurts little more Pain Location: incision site - neck Pain Descriptors / Indicators: Discomfort;Sore Pain Intervention(s): Limited activity within patient's tolerance;Repositioned     Hand Dominance Right   Extremity/Trunk Assessment Upper Extremity Assessment Upper Extremity Assessment: RUE deficits/detail;LUE deficits/detail RUE Deficits / Details: shoulder FF limited to approx 90 degrees, functional strength but weaker than L, slight tremor during functional activities RUE Sensation: decreased light touch RUE Coordination: decreased gross motor LUE Deficits / Details: slightly decreased sensation in hand LUE Sensation: decreased light touch   Lower Extremity Assessment Lower Extremity Assessment: Overall WFL for tasks assessed   Cervical / Trunk Assessment Cervical / Trunk Assessment: Other exceptions Cervical /  Trunk Exceptions: s/p cervical sx   Communication Communication Communication: No difficulties   Cognition Arousal/Alertness: Awake/alert Behavior During Therapy: WFL for tasks assessed/performed Overall Cognitive Status: Within Functional Limits for tasks assessed                                     General Comments       Exercises     Shoulder Instructions      Home Living Family/patient expects to be discharged to::  Private residence Living Arrangements: Spouse/significant other Available Help at Discharge: Family;Available 24 hours/day Type of Home: House             Bathroom Shower/Tub: Chief Strategy Officer: Standard     Home Equipment: Hand held shower head          Prior Functioning/Environment Level of Independence: Independent                 OT Problem List:        OT Treatment/Interventions:      OT Goals(Current goals can be found in the care plan section) Acute Rehab OT Goals Patient Stated Goal: get full use of RUE back OT Goal Formulation: With patient Time For Goal Achievement: 04/24/18 Potential to Achieve Goals: Good  OT Frequency:     Barriers to D/C:            Co-evaluation              AM-PAC PT "6 Clicks" Daily Activity     Outcome Measure Help from another person eating meals?: None Help from another person taking care of personal grooming?: None Help from another person toileting, which includes using toliet, bedpan, or urinal?: None Help from another person bathing (including washing, rinsing, drying)?: None Help from another person to put on and taking off regular upper body clothing?: None Help from another person to put on and taking off regular lower body clothing?: None 6 Click Score: 24   End of Session Equipment Utilized During Treatment: Cervical collar Nurse Communication: Mobility status  Activity Tolerance: Patient tolerated treatment well Patient left: in chair;with call bell/phone within reach                   Time: 1610-9604 OT Time Calculation (min): 29 min Charges:  OT General Charges $OT Visit: 1 Visit OT Evaluation $OT Eval Low Complexity: 1 Low OT Treatments $Self Care/Home Management : 8-22 mins  Sherryl Manges OTR/L Acute Rehabilitation Services Pager: [phone] Office: [phone]  Evern Bio Mercede Rollo 04/10/2018, 10:27 AM

## 2018-04-10 NOTE — Discharge Summary (Signed)
Physician Discharge Summary  Patient ID: Brent Garcia MRN: [id number] DOB/AGE: 1946/02/11 72 y.o.  Admit date: 04/09/2018 Discharge date: 04/10/2018  Admission Diagnoses: cervical stenosis with myelopathy    Discharge Diagnoses: same   Discharged Condition: good  Hospital Course: The patient was admitted on 04/09/2018 and taken to the operating room where the patient underwent ACDF C3-7. The patient tolerated the procedure well and was taken to the recovery room and then to the floor in stable condition. The hospital course was routine. There were no complications. The wound remained clean dry and intact. Pt had appropriate neck soreness. No complaints of arm pain or new N/T/W. The patient remained afebrile with stable vital signs, and tolerated a regular diet. The patient continued to increase activities, and pain was well controlled with oral pain medications.   Consults: None  Significant Diagnostic Studies:  Results for orders placed or performed during the hospital encounter of 04/01/18  Surgical pcr screen  Result Value Ref Range   MRSA, PCR NEGATIVE NEGATIVE   Staphylococcus aureus NEGATIVE NEGATIVE  Basic metabolic panel  Result Value Ref Range   Sodium 139 135 - 145 mmol/L   Potassium 4.2 3.5 - 5.1 mmol/L   Chloride 104 98 - 111 mmol/L   CO2 25 22 - 32 mmol/L   Glucose, Bld 115 (H) 70 - 99 mg/dL   BUN 19 8 - 23 mg/dL   Creatinine, Ser 4.09 0.61 - 1.24 mg/dL   Calcium 9.5 8.9 - 81.1 mg/dL   GFR calc non Af Amer >60 >60 mL/min   GFR calc Af Amer >60 >60 mL/min   Anion gap 10 5 - 15  CBC WITH DIFFERENTIAL  Result Value Ref Range   WBC 7.0 4.0 - 10.5 K/uL   RBC 4.79 4.22 - 5.81 MIL/uL   Hemoglobin 15.2 13.0 - 17.0 g/dL   HCT 91.4 78.2 - 95.6 %   MCV 99.0 78.0 - 100.0 fL   MCH 31.7 26.0 - 34.0 pg   MCHC 32.1 30.0 - 36.0 g/dL   RDW 21.3 08.6 - 57.8 %   Platelets 277 150 - 400 K/uL   Neutrophils Relative % 62 %   Neutro Abs 4.4 1.7 - 7.7 K/uL   Lymphocytes  Relative 22 %   Lymphs Abs 1.5 0.7 - 4.0 K/uL   Monocytes Relative 10 %   Monocytes Absolute 0.7 0.1 - 1.0 K/uL   Eosinophils Relative 4 %   Eosinophils Absolute 0.3 0.0 - 0.7 K/uL   Basophils Relative 1 %   Basophils Absolute 0.1 0.0 - 0.1 K/uL   Immature Granulocytes 1 %   Abs Immature Granulocytes 0.1 0.0 - 0.1 K/uL  Protime-INR  Result Value Ref Range   Prothrombin Time 13.7 11.4 - 15.2 seconds   INR 1.06   Type and screen [facility]  Result Value Ref Range   ABO/RH(D) O POS    Antibody Screen NEG    Sample Expiration 04/15/2018    Extend sample reason      NO TRANSFUSIONS OR PREGNANCY IN THE PAST 3 MONTHS Performed at [facility], [address], [place], [place] [address]   ABO/Rh  Result Value Ref Range   ABO/RH(D)      O POS Performed at [facility], [address] [place], [place] [address]     Chest 2 View  Result Date: 04/01/2018 CLINICAL DATA:  Preoperative cervical fusion.  Cough. EXAM: CHEST - 2 VIEW COMPARISON:  None.  FINDINGS: There is a probable nipple shadow on the left. There is no edema or consolidation. Heart size and pulmonary vascularity are normal. No adenopathy. There is degenerative change in the midthoracic spine. There is evidence of old trauma involving the distal left clavicle. IMPRESSION: Probable nipple shadow left base. Repeat study with nipple markers advised to confirm. No edema or consolidation. Cardiac silhouette within normal limits. Electronically Signed   By: Bretta Bang III M.D.   On: 04/01/2018 14:14   Dg Cervical Spine 2-3 Views  Result Date: 04/09/2018 CLINICAL DATA:  Intraoperative imaging for patient undergoing C3-7 discectomy and fusion. EXAM: DG C-ARM 61-120 MIN; CERVICAL SPINE - 2-3 VIEW COMPARISON:  MRI cervical spine 03/18/2018. FINDINGS: Four fluoroscopic intraoperative spot views of the cervical spine are provided. Images demonstrate anterior plate and screws and interbody spacers in place  from C3-C7. No acute abnormality is identified. IMPRESSION: Intraoperative imaging for C3-7 ACDF.  No acute finding. Electronically Signed   By: Drusilla Kanner M.D.   On: 04/09/2018 15:13   Mr Cervical Spine Wo Contrast  Result Date: 03/19/2018 [facility] NEUROIM[facility] REPORT STUDY DATE: 03/18/18 PATIENT NAME: Brent Garcia DOB: April 20, 1946 MRN: [id number] ORDERING CLINICIAN: Joycelyn Schmid, MD CLINICAL HISTORY: 72 year old male with upper extremity numbness and weakness. EXAM: MRI cervical spine (without) TECHNIQUE: MRI of the cervical spine was obtained utilizing 3 mm sagittal slices from the posterior fossa down to the T3-4 level with T1, T2 and inversion recovery views. In addition 4 mm axial slices from C2-3 down to T1-2 level were included with T2 and gradient echo views. CONTRAST: no COMPARISON: none IMAGING SITE: [facility] [address] (1.5 Tesla MRI)  FINDINGS: On sagittal views the vertebral bodies have normal height and alignment. Degenerative spondylosis and disc bulging at C3-4, C4-5, C5-6, C6-7.  Disc bulging at C7-T1, T1-2 and T2-3.  Mild posterior subluxation of C3 on C4 measuring 2 mm and C5 on C6 measuring 3 mm. Severe spinal stenosis and cord compression at C5-6 with T2 hyperintense spinal cord signal. The posterior fossa, pituitary gland and paraspinal soft tissues are unremarkable.  On axial views: C2-3: no spinal stenosis or foraminal narrowing C3-4: disc bulging, uncovertebral joint hypertrophy, facet hypertrophy with severe biforaminal stenosis C4-5: disc bulging, uncovertebral joint hypertrophy, facet hypertrophy with severe left foraminal stenosis C5-6: disc bulging and facet hypertrophy with severe spinal stenosis and severe biforaminal stenosis; myelomalacia and T2 hyperintense signal noted from C5-6 down to C6-7 level. C6-7: disc bulging and facet hypertrophy with mild spinal stenosis and severe biforaminal stenosis C7-T1: facet hypertrophy with  moderate biforaminal stenosis T1-2: left facet hypertrophy with no spinal stenosis or foraminal narrowing Limited views of the soft tissues of the head and neck are unremarkable.   Abnormal MRI cervical spine (without) demonstrating: - At C5-6: disc bulging and facet hypertrophy with severe spinal stenosis and severe biforaminal stenosis; myelomalacia and T2 hyperintense signal noted from C5-6 down to C6-7 level. - At C6-7: disc bulging and facet hypertrophy with mild spinal stenosis and severe biforaminal stenosis - At C3-4: disc bulging, uncovertebral joint hypertrophy, facet hypertrophy with severe biforaminal stenosis - At C4-5: disc bulging, uncovertebral joint hypertrophy, facet hypertrophy with severe left foraminal stenosis -At C7-T1: facet hypertrophy with moderate biforaminal stenosis INTERPRETING PHYSICIAN: Suanne Marker, MD Certified in Neurology, Neurophysiology and Neuroimaging [facility] [address] [place], [place] [address] [phone]   Dg C-arm 1-60 Min  Result Date: 04/09/2018 CLINICAL DATA:  Intraoperative imaging  for patient undergoing C3-7 discectomy and fusion. EXAM: DG C-ARM 61-120 MIN; CERVICAL SPINE - 2-3 VIEW COMPARISON:  MRI cervical spine 03/18/2018. FINDINGS: Four fluoroscopic intraoperative spot views of the cervical spine are provided. Images demonstrate anterior plate and screws and interbody spacers in place from C3-C7. No acute abnormality is identified. IMPRESSION: Intraoperative imaging for C3-7 ACDF.  No acute finding. Electronically Signed   By: Drusilla Kanner M.D.   On: 04/09/2018 15:13    Antibiotics:  Anti-infectives (From admission, onward)   Start     Dose/Rate Route Frequency Ordered Stop   04/09/18 1800  ceFAZolin (ANCEF) IVPB 2g/100 mL premix     2 g 200 mL/hr over 30 Minutes Intravenous Every 8 hours 04/09/18 1629 04/10/18 0032   04/09/18 1148  bacitracin 50,000 Units in sodium chloride 0.9 % 500 mL irrigation   Status:  Discontinued       As needed 04/09/18 1148 04/09/18 1455   04/09/18 0800  ceFAZolin (ANCEF) IVPB 2g/100 mL premix     2 g 200 mL/hr over 30 Minutes Intravenous On call to O.R. 04/09/18 0750 04/09/18 1026   04/09/18 0719  ceFAZolin (ANCEF) 2-4 GM/100ML-% IVPB    Note to Pharmacy:  Lorette Ang, Meredit: cabinet override      04/09/18 0719 04/09/18 1011      Discharge Exam: Blood pressure (!) 150/97, pulse 73, temperature 97.7 F (36.5 C), temperature source Oral, resp. rate 18, SpO2 93 %. Neurologic: Grossly normal with mild weakness of r supraspinatous Incision CDI  Discharge Medications:   Allergies as of 04/10/2018   No Known Allergies     Medication List    STOP taking these medications   naproxen sodium 220 MG tablet Commonly known as:  ALEVE     TAKE these medications   aspirin 81 MG chewable tablet Chew 81 mg by mouth daily.   cholecalciferol 1000 units tablet Commonly known as:  VITAMIN D Take 1,000 Units by mouth daily.   HYDROcodone-acetaminophen 5-325 MG tablet Commonly known as:  NORCO/VICODIN Take 1-2 tablets by mouth every 4 (four) hours as needed for moderate pain.   lovastatin 40 MG tablet Commonly known as:  MEVACOR Take 80 mg by mouth daily.   methocarbamol 500 MG tablet Commonly known as:  ROBAXIN Take 1 tablet (500 mg total) by mouth every 6 (six) hours as needed for muscle spasms.   multivitamin with minerals Tabs tablet Take 1 tablet by mouth daily. Men 50+       Disposition: home   Final Dx: ACDF C34 c45 c56 c67  Discharge Instructions     Remove dressing in 72 hours   Complete by:  As directed    Call MD for:  difficulty breathing, headache or visual disturbances   Complete by:  As directed    Call MD for:  hives   Complete by:  As directed    Call MD for:  persistant dizziness or light-headedness   Complete by:  As directed    Call MD for:  persistant nausea and vomiting   Complete by:  As directed    Call MD for:   redness, tenderness, or signs of infection (pain, swelling, redness, odor or green/yellow discharge around incision site)   Complete by:  As directed    Call MD for:  severe uncontrolled pain   Complete by:  As directed    Call MD for:  temperature >100.4   Complete by:  As directed    Diet - low sodium  heart healthy   Complete by:  As directed    Driving Restrictions   Complete by:  As directed    No driving for 2 weeks, no riding in the car for 1 week   Increase activity slowly   Complete by:  As directed    Lifting restrictions   Complete by:  As directed    No lifting more than 8 lbs         Signed: Nao Linz S 04/10/2018, 7:47 AM

## 2018-04-10 NOTE — Anesthesia Postprocedure Evaluation (Signed)
Anesthesia Post Note  Patient: SHIVANK PINEDO  Procedure(s) Performed: ANTERIOR CERVICAL DECOMPRESSON AND FUSION CERVICAL THREE-FOUR,CERVICAL FOUR-FIVE,CERVICAL FIVE-SIX,CERVICAL SIX-SEVEN (N/A Spine Cervical)     Patient location during evaluation: PACU Anesthesia Type: General Level of consciousness: awake and alert Pain management: pain level controlled Vital Signs Assessment: post-procedure vital signs reviewed and stable Respiratory status: spontaneous breathing, nonlabored ventilation, respiratory function stable and patient connected to nasal cannula oxygen Cardiovascular status: blood pressure returned to baseline and stable Postop Assessment: no apparent nausea or vomiting Anesthetic complications: no    Last Vitals:  Vitals:   04/10/18 0338 04/10/18 0707  BP: 133/86 (!) 150/97  Pulse: 68 73  Resp: 20 18  Temp: 36.8 C 36.5 C  SpO2: (!) 88% 93%    Last Pain:  Vitals:   04/10/18 0707  TempSrc: Oral  PainSc:                  Lucretia Kern

## 2019-02-08 IMAGING — RF DG CERVICAL SPINE 2 OR 3 VIEWS
1 series · 4 of 4 positions shown · non-contrast
Comparison: MRI cervical spine 03/18/2018.

CLINICAL DATA: Intraoperative imaging for patient undergoing C3-7
discectomy and fusion.

EXAM:
DG C-ARM 61-120 MIN; CERVICAL SPINE - 2-3 VIEW

[Series 1: run · 4 of 4 slices shown]
[im 1/4]
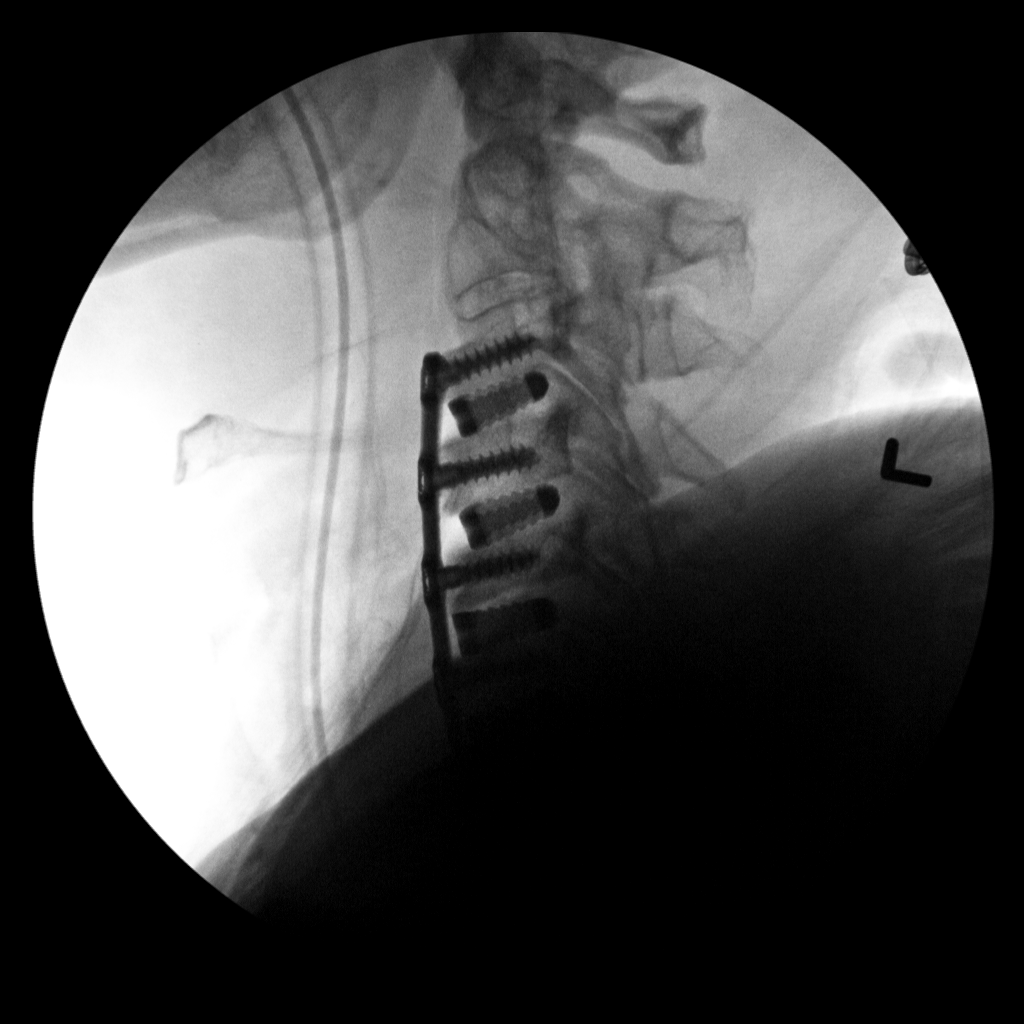
[im 2/4]
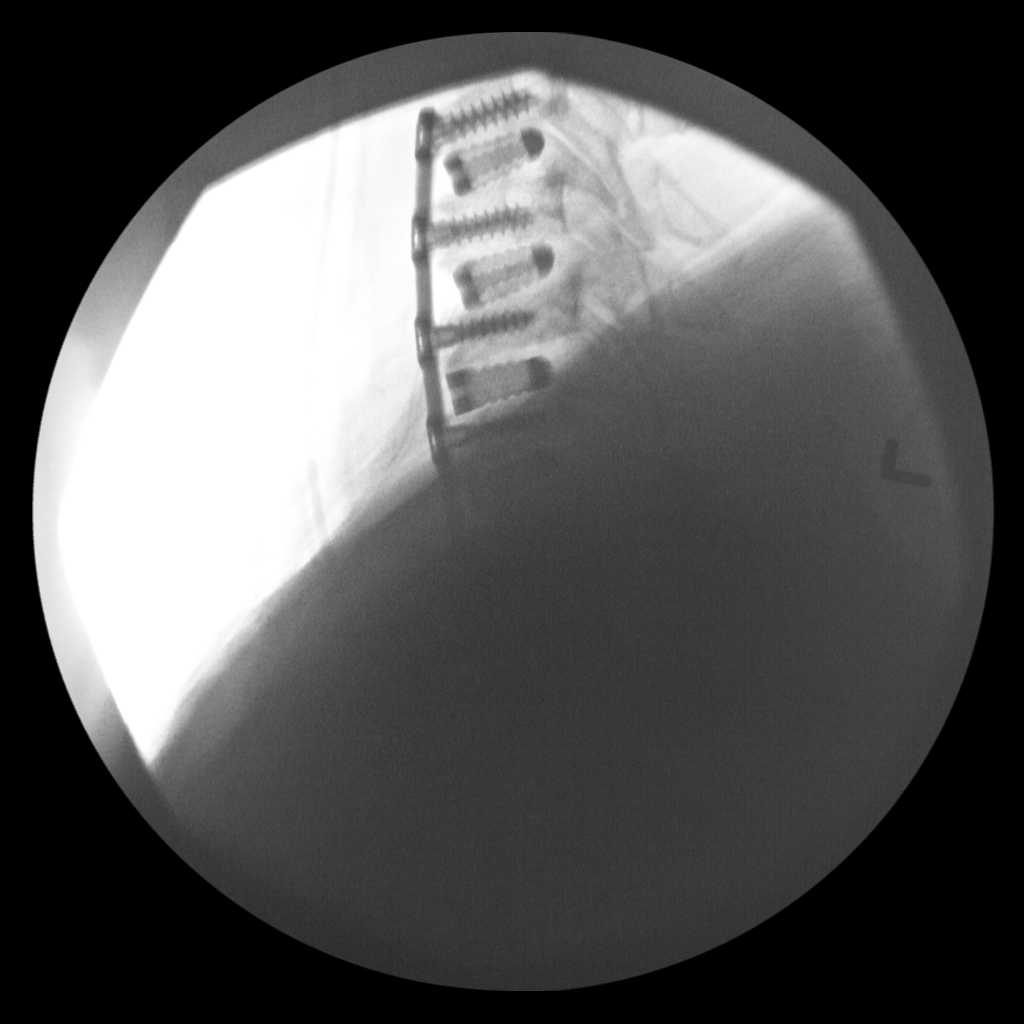
[im 3/4]
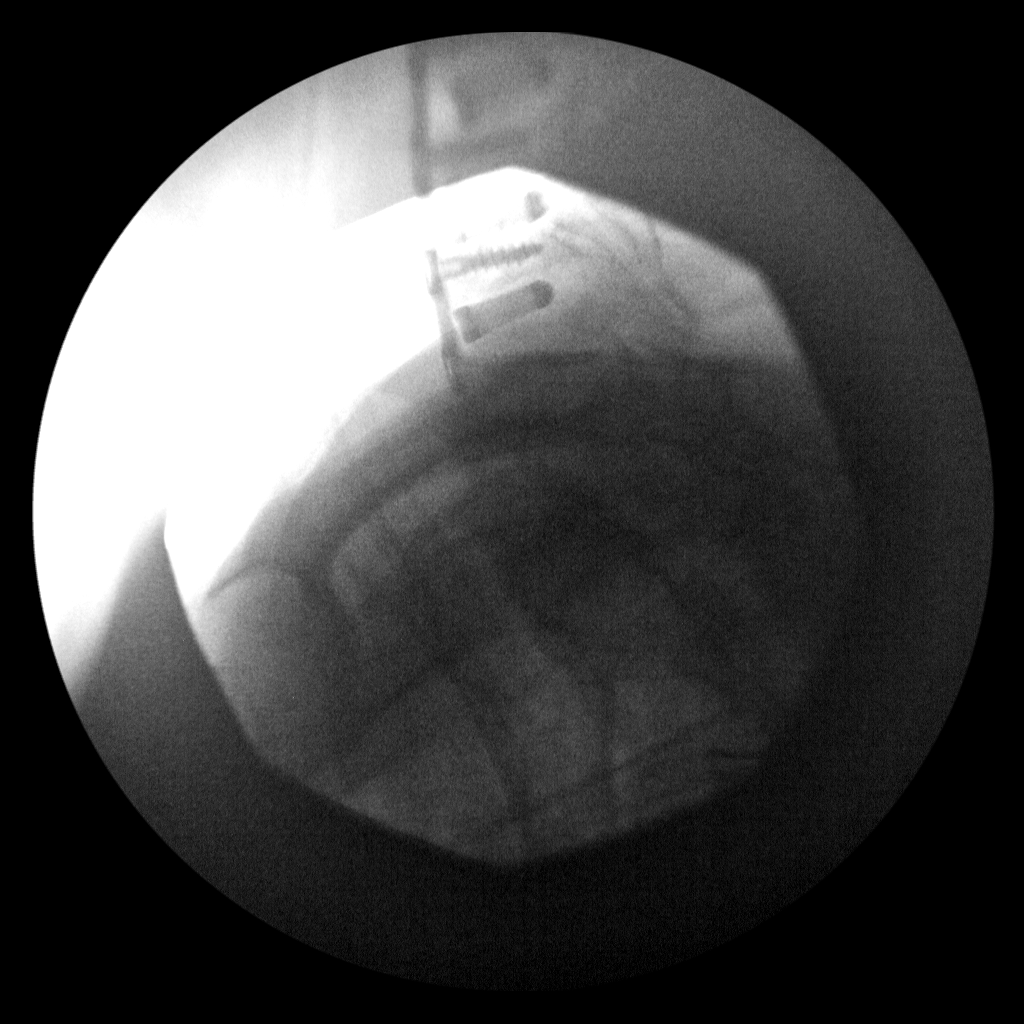
[im 4/4]
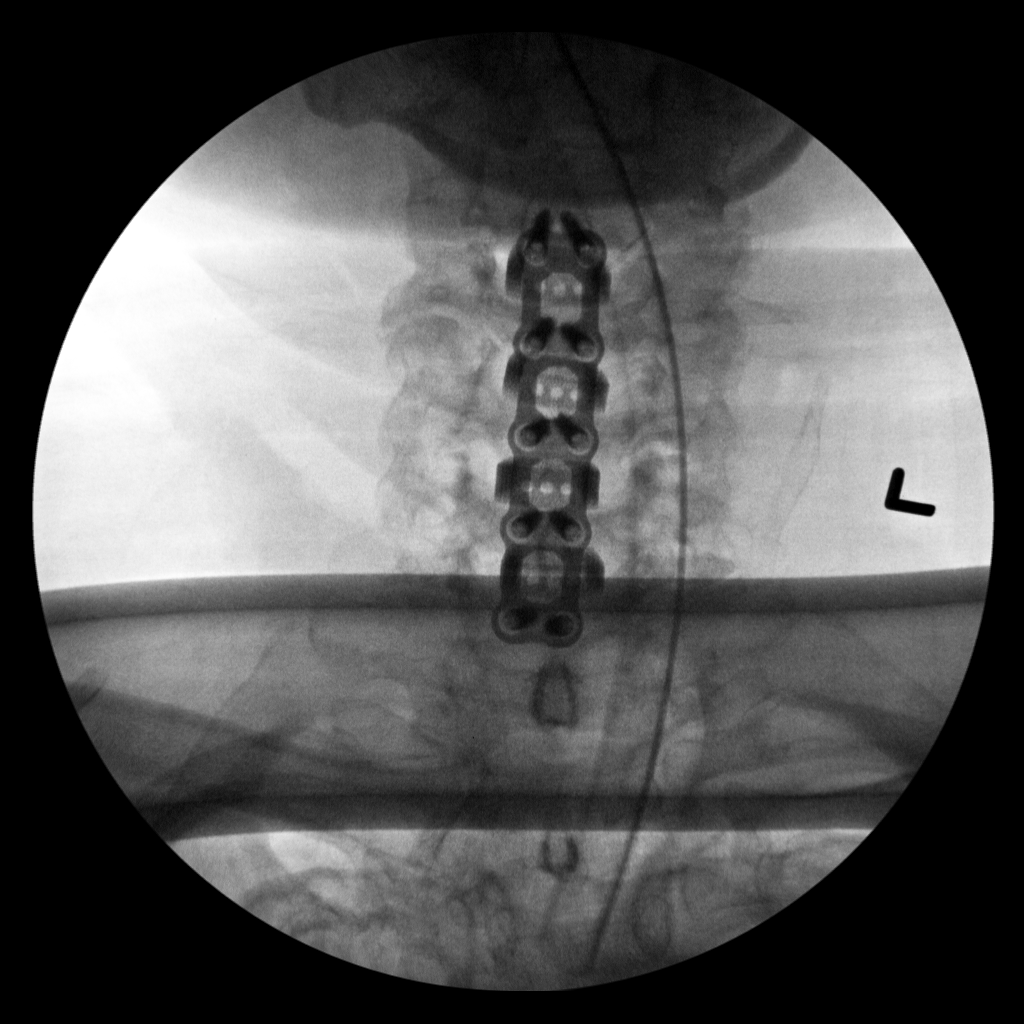

[4 of 4 positions shown; findings below may reference images not displayed]

FINDINGS: Four fluoroscopic intraoperative spot views of the cervical spine
are provided. Images demonstrate anterior plate and screws and
interbody spacers in place from C3-C7. No acute abnormality is
identified.
IMPRESSION: Intraoperative imaging for C3-7 ACDF.  No acute finding.
